# Patient Record
Sex: Male | Born: 1995 | Hispanic: Yes | Marital: Single | Smoking: Never smoker
Health system: Southern US, Community
[De-identification: ages and names within clinical notes are randomized; demographics above are authoritative.]

---
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
## 2016-03-18 ENCOUNTER — Ambulatory Visit
Admission: EM | Admit: 2016-03-18 | Discharge: 2016-03-18 | Discharge status: Absent without leave | Payer: Worker's Compensation

## 2018-07-11 ENCOUNTER — Emergency Department: Payer: No Typology Code available for payment source

## 2018-07-11 ENCOUNTER — Other Ambulatory Visit: Payer: Self-pay

## 2018-07-11 ENCOUNTER — Emergency Department
Admission: EM | Admit: 2018-07-11 | Discharge: 2018-07-11 | Disposition: A | Discharge status: Home / Self Care | Payer: No Typology Code available for payment source | Attending: Emergency Medicine | Admitting: Emergency Medicine

## 2018-07-11 DIAGNOSIS — Y92410 Unspecified street and highway as the place of occurrence of the external cause: Secondary | ICD-10-CM | POA: Insufficient documentation

## 2018-07-11 DIAGNOSIS — Y939 Activity, unspecified: Secondary | ICD-10-CM | POA: Diagnosis not present

## 2018-07-11 DIAGNOSIS — S0990XA Unspecified injury of head, initial encounter: Secondary | ICD-10-CM | POA: Diagnosis present

## 2018-07-11 DIAGNOSIS — F17210 Nicotine dependence, cigarettes, uncomplicated: Secondary | ICD-10-CM | POA: Diagnosis not present

## 2018-07-11 DIAGNOSIS — Y906 Blood alcohol level of 120-199 mg/100 ml: Secondary | ICD-10-CM | POA: Diagnosis not present

## 2018-07-11 DIAGNOSIS — S060X9A Concussion with loss of consciousness of unspecified duration, initial encounter: Secondary | ICD-10-CM | POA: Insufficient documentation

## 2018-07-11 DIAGNOSIS — S3991XA Unspecified injury of abdomen, initial encounter: Secondary | ICD-10-CM | POA: Diagnosis not present

## 2018-07-11 DIAGNOSIS — F1490 Cocaine use, unspecified, uncomplicated: Secondary | ICD-10-CM | POA: Insufficient documentation

## 2018-07-11 DIAGNOSIS — Z23 Encounter for immunization: Secondary | ICD-10-CM | POA: Diagnosis not present

## 2018-07-11 DIAGNOSIS — Y999 Unspecified external cause status: Secondary | ICD-10-CM | POA: Insufficient documentation

## 2018-07-11 DIAGNOSIS — S0101XA Laceration without foreign body of scalp, initial encounter: Secondary | ICD-10-CM

## 2018-07-11 LAB — BASIC METABOLIC PANEL
Anion gap: 14 (ref 5–15)
BUN: 9 mg/dL (ref 6–20)
CO2: 22 mmol/L (ref 22–32)
Calcium: 8.5 mg/dL — ABNORMAL LOW (ref 8.9–10.3)
Chloride: 105 mmol/L (ref 98–111)
Creatinine, Ser: 0.89 mg/dL (ref 0.61–1.24)
GFR calc Af Amer: >60 mL/min (ref >60)
GFR calc non Af Amer: >60 mL/min (ref >60)
Glucose, Bld: 122 mg/dL — ABNORMAL HIGH (ref 70–99)
Potassium: 3.5 mmol/L (ref 3.5–5.1)
Sodium: 141 mmol/L (ref 135–145)

## 2018-07-11 LAB — CBC
HCT: 44 % (ref 39.0–52.0)
Hemoglobin: 16 g/dL (ref 13.0–17.0)
MCH: 30.9 pg (ref 26.0–34.0)
MCHC: 36.4 g/dL — ABNORMAL HIGH (ref 30.0–36.0)
MCV: 85.1 fL (ref 80.0–100.0)
Platelets: 302 10*3/uL (ref 150–400)
RBC: 5.17 MIL/uL (ref 4.22–5.81)
RDW: 11.4 % — ABNORMAL LOW (ref 11.5–15.5)
WBC: 7.8 10*3/uL (ref 4.0–10.5)
nRBC: 0 % (ref 0.0–0.2)

## 2018-07-11 LAB — URINE DRUG SCREEN, QUALITATIVE (ARMC ONLY)
Amphetamines, Ur Screen: NOT DETECTED
Barbiturates, Ur Screen: NOT DETECTED
Benzodiazepine, Ur Scrn: NOT DETECTED
Cannabinoid 50 Ng, Ur ~~LOC~~: POSITIVE — AB
Cocaine Metabolite,Ur ~~LOC~~: POSITIVE — AB
MDMA (Ecstasy)Ur Screen: NOT DETECTED
Methadone Scn, Ur: NOT DETECTED
Opiate, Ur Screen: NOT DETECTED
Phencyclidine (PCP) Ur S: NOT DETECTED
Tricyclic, Ur Screen: NOT DETECTED

## 2018-07-11 LAB — ETHANOL: Alcohol, Ethyl (B): 178 mg/dL — ABNORMAL HIGH (ref <10)

## 2018-07-11 MED ORDER — IOHEXOL 300 MG/ML  SOLN
100.0000 mL | Freq: Once | INTRAMUSCULAR | Status: AC | PRN
  Administered 2018-07-11: 100 mL via INTRAVENOUS

## 2018-07-11 MED ORDER — TETANUS-DIPHTH-ACELL PERTUSSIS 5-2.5-18.5 LF-MCG/0.5 IM SUSP
0.5000 mL | Freq: Once | INTRAMUSCULAR | Status: AC
  Administered 2018-07-11: 16:00:00 0.5 mL via INTRAMUSCULAR
  Filled 2018-07-11: qty 0.5

## 2018-07-11 MED ORDER — IBUPROFEN 600 MG PO TABS
600.0000 mg | ORAL_TABLET | Freq: Once | ORAL | Status: AC
  Administered 2018-07-11: 17:00:00 600 mg via ORAL
  Filled 2018-07-11: qty 1
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
## 2018-07-11 NOTE — ED Notes (Signed)
Patient transported to CT 
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
## 2018-07-11 NOTE — ED Notes (Signed)
Assisted pt with cleaning up after being seen by Dr.

## 2018-07-11 NOTE — ED Notes (Signed)
Pt was talking to brother and stated "I don't know why I keep getting fucking saved. It would be a lot easier if I was deadChief Technology Officer at bedside.

## 2018-07-11 NOTE — ED Notes (Signed)
Pt able to dress independently. Pt able to walk without any difficulty. Pt only reports some generlized pain with walking. Pt refuses wheelchair to lobby but states that he will wait outside for ride to come. Pt given his wallet, license, phone, and paperwork from officer upon d/c.
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
## 2021-05-28 ENCOUNTER — Other Ambulatory Visit: Payer: Self-pay

## 2021-05-28 ENCOUNTER — Emergency Department: Payer: Commercial Managed Care - PPO

## 2021-05-28 ENCOUNTER — Emergency Department
Admission: EM | Admit: 2021-05-28 | Discharge: 2021-05-28 | Disposition: A | Discharge status: Home / Self Care | Payer: Commercial Managed Care - PPO | Attending: Emergency Medicine | Admitting: Emergency Medicine

## 2021-05-28 ENCOUNTER — Encounter: Payer: Self-pay | Admitting: Emergency Medicine

## 2021-05-28 DIAGNOSIS — Y92410 Unspecified street and highway as the place of occurrence of the external cause: Secondary | ICD-10-CM | POA: Insufficient documentation

## 2021-05-28 DIAGNOSIS — S022XXA Fracture of nasal bones, initial encounter for closed fracture: Secondary | ICD-10-CM | POA: Diagnosis not present

## 2021-05-28 DIAGNOSIS — S0242XA Fracture of alveolus of maxilla, initial encounter for closed fracture: Secondary | ICD-10-CM | POA: Diagnosis not present

## 2021-05-28 DIAGNOSIS — S0993XA Unspecified injury of face, initial encounter: Secondary | ICD-10-CM | POA: Diagnosis present

## 2021-05-28 MED ORDER — CEPHALEXIN 500 MG PO CAPS: 500.0000 mg | ORAL_CAPSULE | Freq: Two times a day (BID) | ORAL | 0 refills | Status: AC
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
## 2021-05-28 NOTE — Discharge Instructions (Addendum)
-  Call [facility] [phone]) tomorrow to schedule follow up ?-Take Tylenol/ibuprofen as needed for pain. ?-Take all of your antibiotics as prescribed ?-Return to the emergency department anytime if you begin to experience any new or worsening symptoms ?
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
## 2021-05-29 NOTE — ED Notes (Signed)
[facility] to [facility] and faxed referral to office  [phone] ?
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
